# Patient Record
Sex: Female | Born: 1965 | Race: White | Hispanic: No | Marital: Married | Smoking: Never smoker
Health system: Southern US, Community
[De-identification: ages and names within clinical notes are randomized; demographics above are authoritative.]

## PROBLEM LIST (undated history)

## (undated) HISTORY — PX: HERNIA REPAIR: SHX51

---

## 1999-01-20 ENCOUNTER — Other Ambulatory Visit: Admission: RE | Admit: 1999-01-20 | Discharge: 1999-01-20 | Payer: Self-pay | Admitting: Obstetrics & Gynecology

## 2000-12-15 ENCOUNTER — Encounter: Payer: Self-pay | Admitting: Emergency Medicine

## 2000-12-15 ENCOUNTER — Emergency Department (HOSPITAL_COMMUNITY): Admission: EM | Admit: 2000-12-15 | Discharge: 2000-12-16 | Payer: Self-pay | Admitting: Emergency Medicine

## 2003-12-07 ENCOUNTER — Encounter: Admission: RE | Admit: 2003-12-07 | Discharge: 2003-12-07 | Payer: Self-pay | Admitting: Surgery

## 2004-03-17 ENCOUNTER — Emergency Department (HOSPITAL_COMMUNITY): Admission: EM | Admit: 2004-03-17 | Discharge: 2004-03-17 | Payer: Self-pay | Admitting: Emergency Medicine

## 2004-05-09 ENCOUNTER — Ambulatory Visit: Payer: Self-pay | Admitting: Gastroenterology

## 2004-05-10 ENCOUNTER — Ambulatory Visit: Payer: Self-pay | Admitting: Gastroenterology

## 2004-07-20 ENCOUNTER — Emergency Department (HOSPITAL_COMMUNITY): Admission: EM | Admit: 2004-07-20 | Discharge: 2004-07-20 | Payer: Self-pay | Admitting: Emergency Medicine

## 2007-11-07 ENCOUNTER — Encounter: Admission: RE | Admit: 2007-11-07 | Discharge: 2007-11-07 | Payer: Self-pay | Admitting: Obstetrics & Gynecology

## 2008-01-14 ENCOUNTER — Emergency Department (HOSPITAL_COMMUNITY): Admission: EM | Admit: 2008-01-14 | Discharge: 2008-01-14 | Payer: Self-pay | Admitting: Family Medicine

## 2013-12-25 ENCOUNTER — Encounter: Payer: Self-pay | Admitting: Gastroenterology

## 2014-01-18 ENCOUNTER — Other Ambulatory Visit: Payer: Self-pay | Admitting: Obstetrics & Gynecology

## 2014-01-19 LAB — CYTOLOGY - PAP

## 2014-02-10 ENCOUNTER — Other Ambulatory Visit: Payer: Self-pay | Admitting: Obstetrics & Gynecology

## 2014-02-10 DIAGNOSIS — R928 Other abnormal and inconclusive findings on diagnostic imaging of breast: Secondary | ICD-10-CM

## 2014-02-17 ENCOUNTER — Ambulatory Visit
Admission: RE | Admit: 2014-02-17 | Discharge: 2014-02-17 | Disposition: A | Discharge status: Home / Self Care | Payer: No Typology Code available for payment source | Source: Ambulatory Visit | Attending: Obstetrics & Gynecology | Admitting: Obstetrics & Gynecology

## 2014-02-17 DIAGNOSIS — R928 Other abnormal and inconclusive findings on diagnostic imaging of breast: Secondary | ICD-10-CM

## 2014-02-24 ENCOUNTER — Encounter: Payer: Self-pay | Admitting: Gastroenterology

## 2014-09-17 ENCOUNTER — Other Ambulatory Visit: Payer: Self-pay | Admitting: Obstetrics & Gynecology

## 2014-10-06 ENCOUNTER — Encounter: Payer: Self-pay | Admitting: Gastroenterology

## 2017-07-31 ENCOUNTER — Other Ambulatory Visit: Payer: Self-pay | Admitting: Obstetrics & Gynecology

## 2017-07-31 DIAGNOSIS — N6320 Unspecified lump in the left breast, unspecified quadrant: Secondary | ICD-10-CM

## 2017-12-13 ENCOUNTER — Other Ambulatory Visit (HOSPITAL_COMMUNITY): Payer: Self-pay | Admitting: *Deleted

## 2017-12-13 DIAGNOSIS — N6320 Unspecified lump in the left breast, unspecified quadrant: Secondary | ICD-10-CM

## 2018-01-14 ENCOUNTER — Ambulatory Visit
Admission: RE | Admit: 2018-01-14 | Discharge: 2018-01-14 | Disposition: A | Discharge status: Home / Self Care | Payer: No Typology Code available for payment source | Source: Ambulatory Visit | Attending: Obstetrics and Gynecology | Admitting: Obstetrics and Gynecology

## 2018-01-14 ENCOUNTER — Ambulatory Visit (HOSPITAL_COMMUNITY)
Admission: RE | Admit: 2018-01-14 | Discharge: 2018-01-14 | Disposition: A | Discharge status: Home / Self Care | Payer: Self-pay | Source: Ambulatory Visit | Attending: Obstetrics and Gynecology | Admitting: Obstetrics and Gynecology

## 2018-01-14 ENCOUNTER — Encounter (HOSPITAL_COMMUNITY): Payer: Self-pay

## 2018-01-14 VITALS — BP 110/72 | Ht 68.0 in

## 2018-01-14 DIAGNOSIS — N6320 Unspecified lump in the left breast, unspecified quadrant: Secondary | ICD-10-CM

## 2018-01-14 DIAGNOSIS — Z1239 Encounter for other screening for malignant neoplasm of breast: Secondary | ICD-10-CM

## 2018-01-14 DIAGNOSIS — N644 Mastodynia: Secondary | ICD-10-CM

## 2018-01-14 NOTE — Progress Notes (Signed)
Complaints of left outer breast and axillary pain x one year that comes and goes. Complaints of bilateral spontaneous nipple discharge that she sees on bra. Patient states it looks like a grayish color.  Pap Smear: Pap smear not completed today. Last Pap smear was in May 2019 at [facility] and normal per patient. Per patient has no history of an abnormal Pap smear. No Pap smear results are in Epic.  Physical exam: Breasts Breasts symmetrical. No skin abnormalities bilateral breasts. No nipple retraction bilateral breasts. No nipple discharge bilateral breasts. Unable to express any nipple discharge from bilateral breasts on exam. No lymphadenopathy. No lumps palpated bilateral breasts. Complaints of left outer and inner breast pain on exam. Complaints of right outer breast tenderness on exam. [facility]. Appointment scheduled for Tuesday, January 14, 2018 at 1320.        Pelvic/Bimanual No Pap smear completed today since last Pap smear was in May 2019 per patient. Pap smear not indicated per BCCCP guidelines.   Smoking History: Patient is a former e-cigarette smoker that quit in 2017.  Patient Navigation: Patient education provided. [facility].   Colorectal Cancer Screening: Per patient had a colonoscopy completed in 2005. No complaints today. FIT Test given to patient to complete and return to BCCCP.  Breast and Cervical Cancer Risk Assessment: Patient has no family history of breast cancer, known genetic mutations, or radiation treatment to the chest before age 53. Patient has no history of cervical dysplasia, immunocompromised, or DES exposure in-utero.  Risk Assessment    Risk Scores      01/14/2018   Last edited by: Priscille HeidelbergBrannock, Eladia Frame P, RN   5-year risk: 1 %   Lifetime risk: 7.7 %

## 2018-01-14 NOTE — Patient Instructions (Signed)
Explained breast self awareness with Guinevere FerrariJudy Vanaman. Patient did not need a Pap smear today due to last Pap smear was in May 2019 per patient. Let her know BCCCP will cover Pap smears every 3 years unless has a history of abnormal Pap smears. [facility]. Appointment scheduled for Tuesday, January 14, 2018 at 1320. Darel HongJudy Whitfill verbalized understanding.  Eliza Green, Kathaleen Maserhristine Poll, RN 1:39 PM

## 2018-01-15 ENCOUNTER — Encounter (HOSPITAL_COMMUNITY): Payer: Self-pay | Admitting: *Deleted

## 2018-04-02 ENCOUNTER — Other Ambulatory Visit: Payer: Self-pay

## 2018-04-05 LAB — FECAL OCCULT BLOOD, IMMUNOCHEMICAL: FECAL OCCULT BLD: NEGATIVE

## 2018-04-18 ENCOUNTER — Encounter (HOSPITAL_COMMUNITY): Payer: Self-pay

## 2019-05-25 ENCOUNTER — Ambulatory Visit: Payer: HRSA Program | Attending: Internal Medicine

## 2019-05-25 DIAGNOSIS — R238 Other skin changes: Secondary | ICD-10-CM | POA: Diagnosis present

## 2019-05-25 DIAGNOSIS — Z20828 Contact with and (suspected) exposure to other viral communicable diseases: Secondary | ICD-10-CM | POA: Insufficient documentation

## 2019-05-25 DIAGNOSIS — U071 COVID-19: Secondary | ICD-10-CM

## 2019-05-27 LAB — NOVEL CORONAVIRUS, NAA: SARS-CoV-2, NAA: NOT DETECTED

## 2020-01-09 ENCOUNTER — Ambulatory Visit (HOSPITAL_COMMUNITY)
Admission: EM | Admit: 2020-01-09 | Discharge: 2020-01-09 | Disposition: A | Discharge status: Home / Self Care | Payer: HRSA Program | Attending: Physician Assistant | Admitting: Physician Assistant

## 2020-01-09 ENCOUNTER — Encounter (HOSPITAL_COMMUNITY): Payer: Self-pay

## 2020-01-09 DIAGNOSIS — Z20822 Contact with and (suspected) exposure to covid-19: Secondary | ICD-10-CM | POA: Insufficient documentation

## 2020-01-09 DIAGNOSIS — R5383 Other fatigue: Secondary | ICD-10-CM | POA: Insufficient documentation

## 2020-01-09 NOTE — ED Provider Notes (Signed)
[facility]    CSN: [id number] Arrival date & time: 01/09/20  1737      History   Chief Complaint Chief Complaint  Patient presents with   Fatigue    HPI Brenda Parrish is a 54 y.o. female.   She reports for Covid testing.  She had an episode of fatigue last night after working in the yard.  She felt more drugged out than usual.  Denies any chest pain, shortness of breath or nausea or sweating when this occurred..  Reports she feels much better today.  Denies chest pain, shortness of breath, cough, runny nose, sore throat.  States she otherwise feels well.  She does not want to Covid test that she has bouts are taking care of her father.     History reviewed. No pertinent past medical history.  There are no problems to display for this patient.   History reviewed. No pertinent surgical history.  OB History    Gravida  2   Para      Term      Preterm      AB      Living  2     SAB      TAB      Ectopic      Multiple      Live Births  2            Home Medications    Prior to Admission medications   Not on File    Family History Family History  Problem Relation Age of Onset   Diabetes Mother    Diabetes Father    Cancer Father        prostate cancer    Social History Social History   Tobacco Use   Smoking status: Never Smoker   Smokeless tobacco: Never Used  Vaping Use   Vaping Use: Former   Start date: 01/14/2014   Quit date: 01/15/2016  Substance Use Topics   Alcohol use: Not Currently   Drug use: Never     Allergies   Latex and Sulfa antibiotics   Review of Systems Review of Systems   Physical Exam Triage Vital Signs ED Triage Vitals  Enc Vitals Group     BP      Pulse      Resp      Temp      Temp src      SpO2      Weight      Height      Head Circumference      Peak Flow      Pain Score      Pain Loc      Pain Edu?      Excl. in GC?    No data found.  Updated Vital Signs BP  113/68    Pulse 96    Temp 98 F (36.7 C) (Oral)    Resp 16    Ht 5' 7.5" (1.715 m)    Wt 289 lb (131.1 kg)    SpO2 99%    BMI 44.60 kg/m   Visual Acuity Right Eye Distance:   Left Eye Distance:   Bilateral Distance:    Right Eye Near:   Left Eye Near:    Bilateral Near:     Physical Exam Vitals and nursing note reviewed.  Constitutional:      General: She is not in acute distress.    Appearance: She is well-developed.  HENT:  Head: Normocephalic and atraumatic.  Eyes:     Conjunctiva/sclera: Conjunctivae normal.  Cardiovascular:     Rate and Rhythm: Normal rate and regular rhythm.     Heart sounds: No murmur heard.   Pulmonary:     Effort: Pulmonary effort is normal. No respiratory distress.     Breath sounds: Normal breath sounds.  Abdominal:     Palpations: Abdomen is soft.     Tenderness: There is no abdominal tenderness.  Musculoskeletal:     Cervical back: Neck supple.  Skin:    General: Skin is warm and dry.  Neurological:     Mental Status: She is alert.      UC Treatments / Results  Labs (all labs ordered are listed, but only abnormal results are displayed) Labs Reviewed  SARS CORONAVIRUS 2 (TAT 6-24 HRS)    EKG   Radiology No results found.  Procedures Procedures (including critical care time)  Medications Ordered in UC Medications - No data to display  Initial Impression / Assessment and Plan / UC Course  I have reviewed the triage vital signs and the nursing notes.  Pertinent labs & imaging results that were available during my care of the patient were reviewed by me and considered in my medical decision making (see chart for details).     #Fatigue #Encounter for Covid test Patient is a 54 year old man episode of increased fatigue after working in the yard.  Feeling much better today.  Normal vital signs and reassuring exam.  We will send Covid test.  Discussed emergency department precautions.  Instructed patient that she is to  follow-up with primary care provider, internal medicine resource given.  Patient verbalized agreement understanding plan of care. Final Clinical Impressions(s) / UC Diagnoses   Final diagnoses:  Fatigue, unspecified type  Encounter for laboratory testing for COVID-19 virus     Discharge Instructions     Estbablish with a primary care  If chest pain, shortness of breath accompany fatigue at any point, go to the Emergency department   If your Covid-19 test is positive, you will receive a phone call from [facility] regarding your results. Negative test results are not called. Both positive and negative results area always visible on MyChart. If you do not have a MyChart account, sign up instructions are in your discharge papers.   Persons who are directed to care for themselves at home may discontinue isolation under the following conditions:   At least 10 days have passed since symptom onset and  At least 24 hours have passed without running a fever (this means without the use of fever-reducing medications) and  Other symptoms have improved.  Persons infected with COVID-19 who never develop symptoms may discontinue isolation and other precautions 10 days after the date of their first positive COVID-19 test.     ED Prescriptions    None     PDMP not reviewed this encounter.   Hermelinda Medicus, PA-C 01/09/20 1855

## 2020-01-09 NOTE — ED Triage Notes (Signed)
Pt c/o fatigue started last night and wants COVID test.

## 2020-01-09 NOTE — Discharge Instructions (Addendum)
Estbablish with a primary care  If chest pain, shortness of breath accompany fatigue at any point, go to the Emergency department   If your Covid-19 test is positive, you will receive a phone call from [facility] regarding your results. Negative test results are not called. Both positive and negative results area always visible on MyChart. If you do not have a MyChart account, sign up instructions are in your discharge papers.   Persons who are directed to care for themselves at home may discontinue isolation under the following conditions:   At least 10 days have passed since symptom onset and  At least 24 hours have passed without running a fever (this means without the use of fever-reducing medications) and  Other symptoms have improved.  Persons infected with COVID-19 who never develop symptoms may discontinue isolation and other precautions 10 days after the date of their first positive COVID-19 test.

## 2020-01-10 LAB — SARS CORONAVIRUS 2 (TAT 6-24 HRS): SARS Coronavirus 2: NEGATIVE

## 2020-07-14 IMAGING — MG DIGITAL DIAGNOSTIC BILATERAL MAMMOGRAM WITH TOMO AND CAD
6 of 10 series · 6 of 30 positions shown · non-contrast
Comparison: Previous exam(s).

CLINICAL DATA: Palpable lump within the upper-outer quadrant of the
LEFT breast for approximately 1 year. History of LEFT breast cyst.

EXAM:
DIGITAL DIAGNOSTIC BILATERAL MAMMOGRAM WITH CAD AND TOMO
ULTRASOUND LEFT BREAST

[R MLO synth-2D]
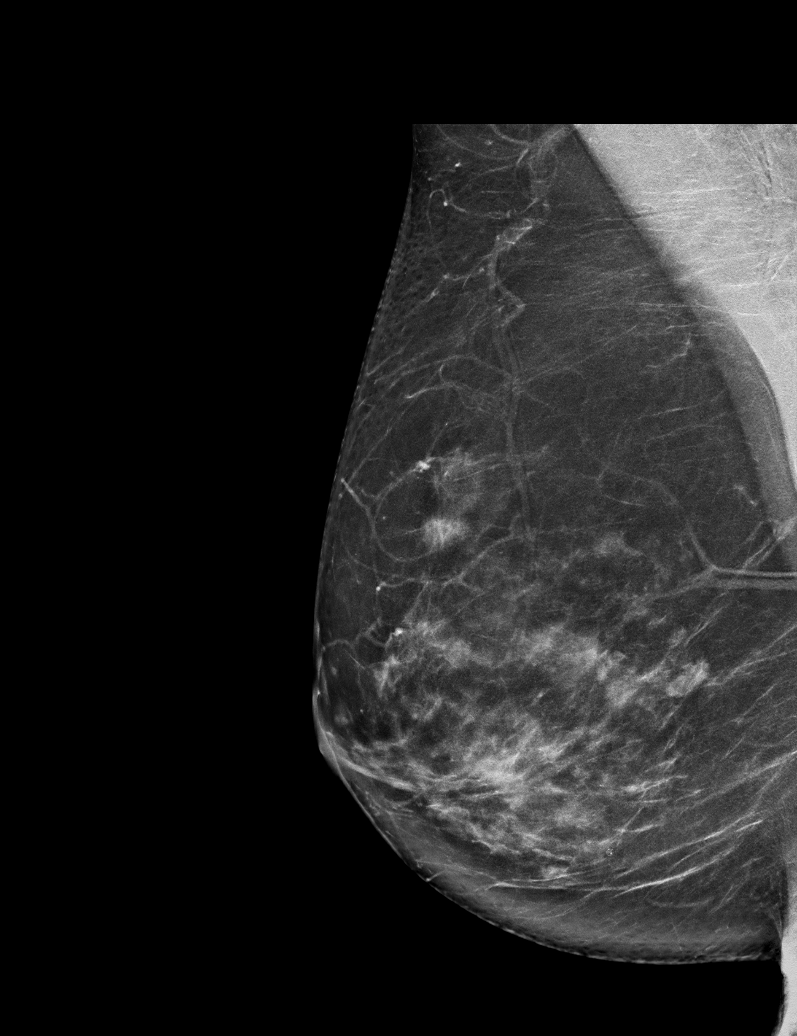

[L TAN synth-2D]
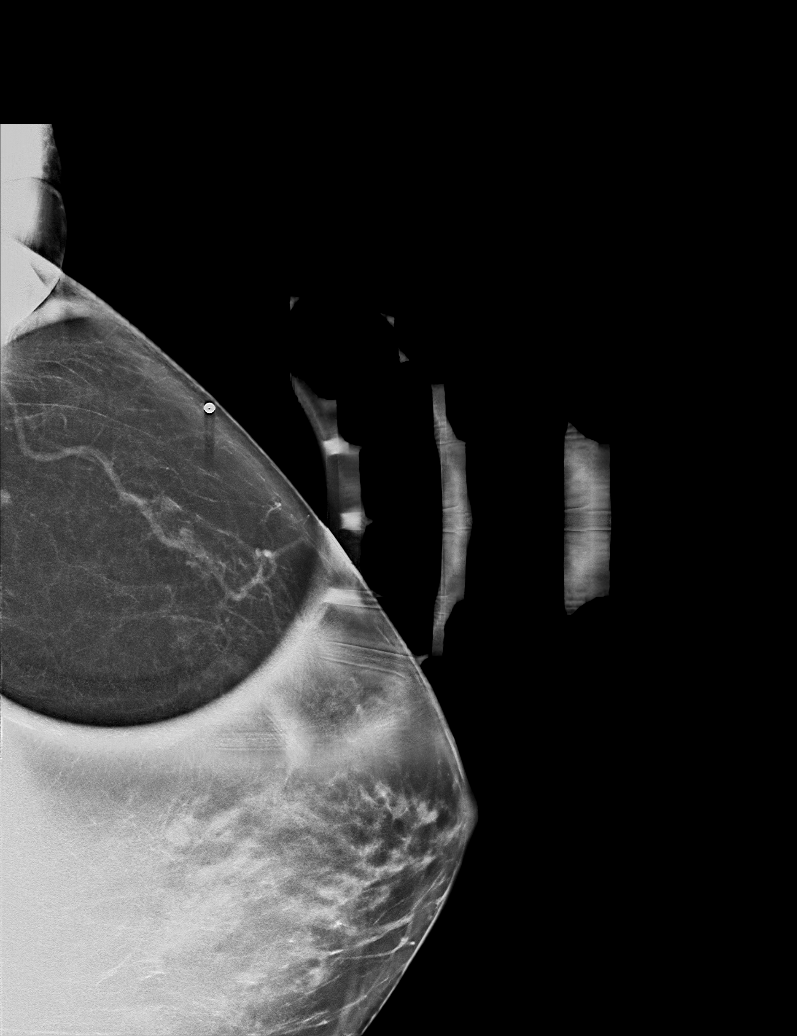

[L CC synth-2D]
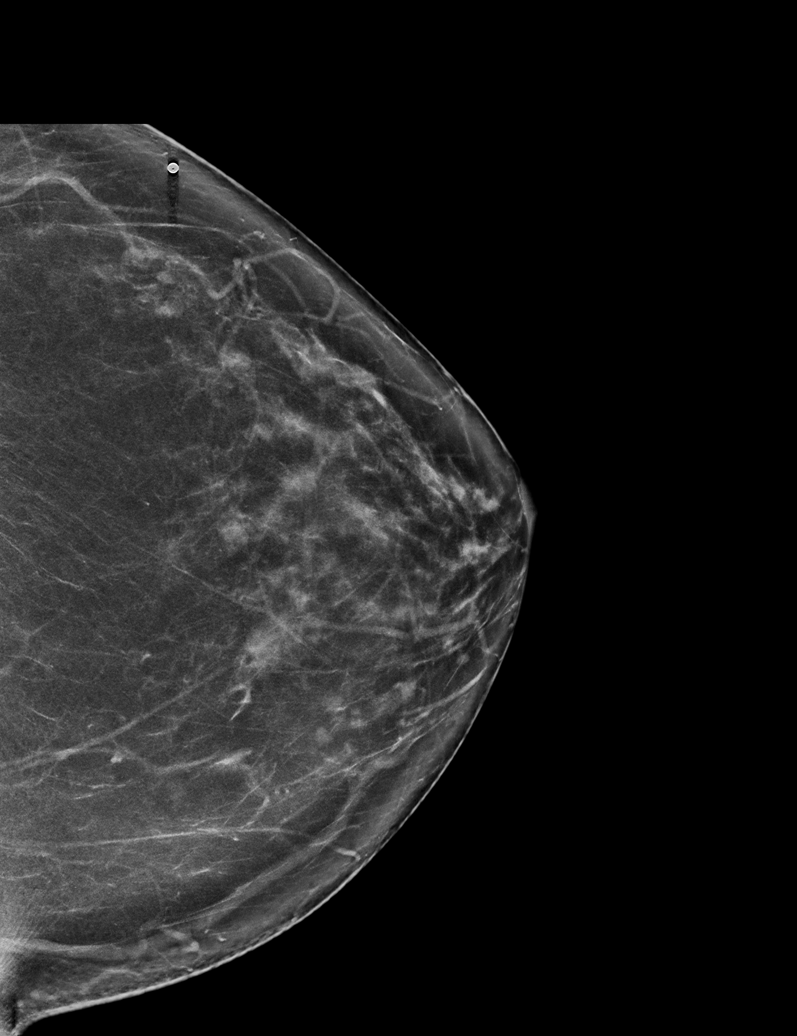

[R CC synth-2D]
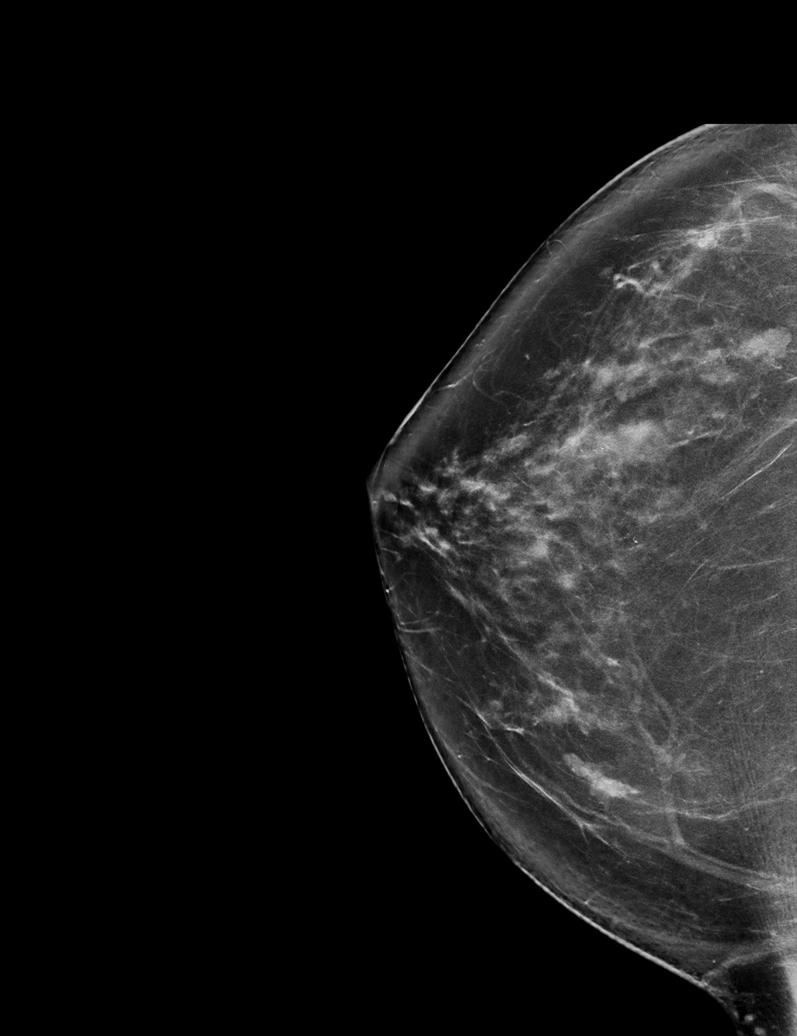

[L MLO synth-2D]
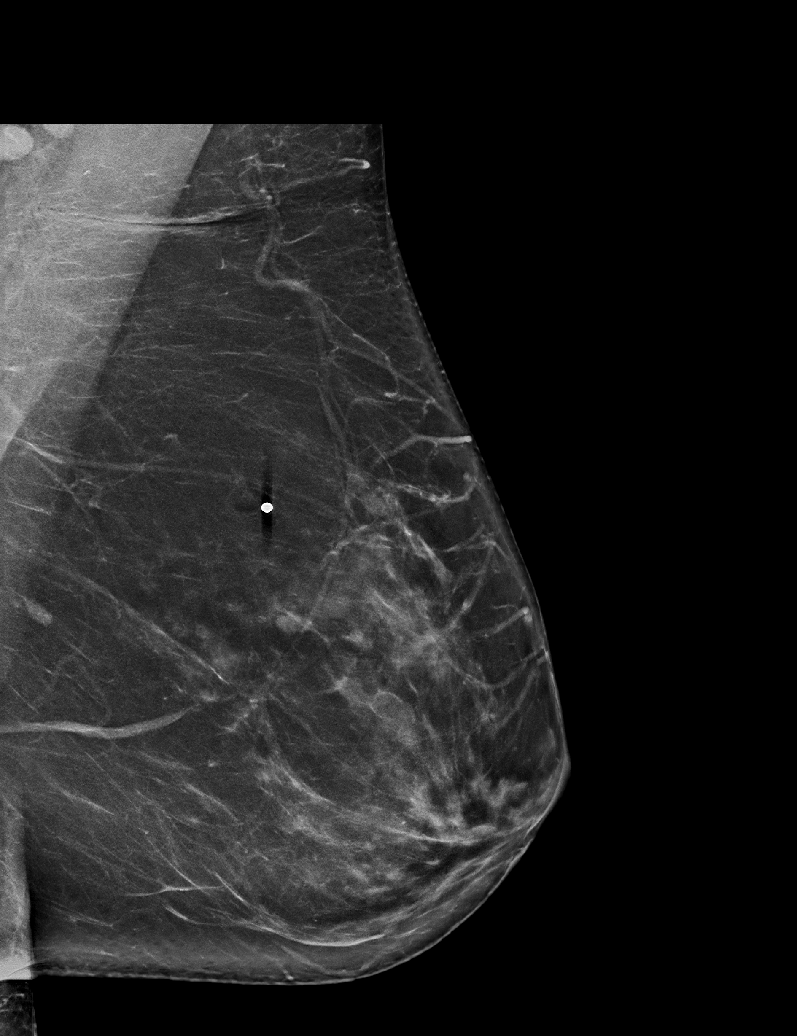

[R CC tomo · tomo slice 41/82.0]
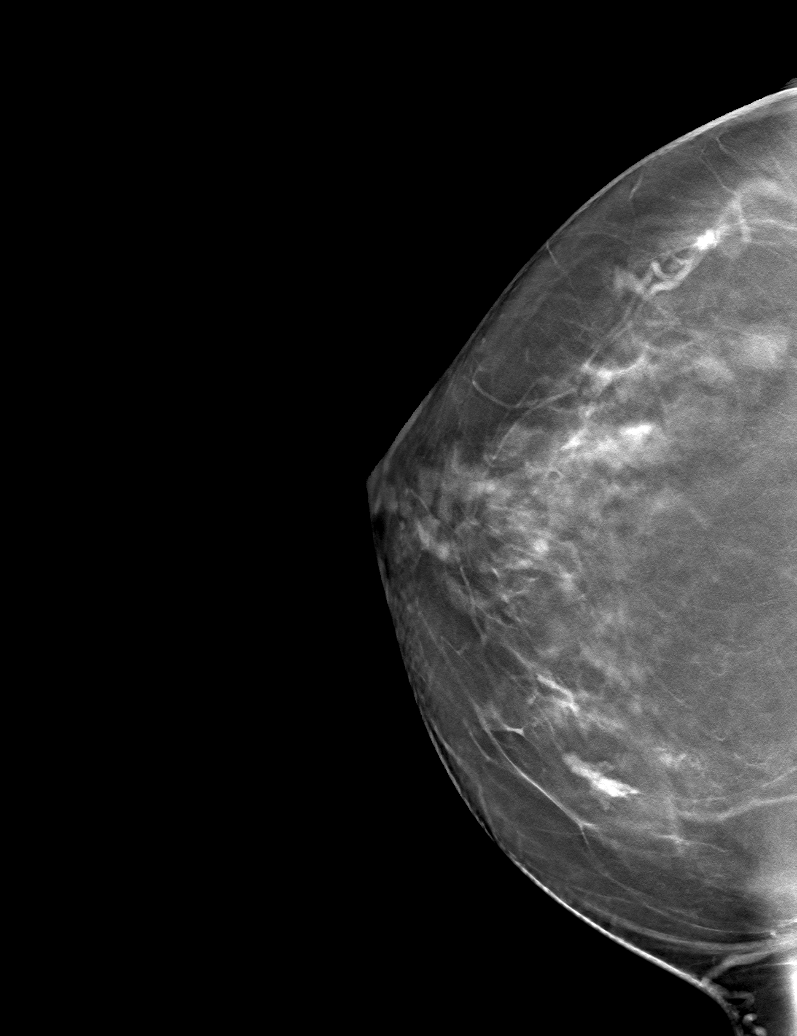

[6 of 30 positions shown; findings below may reference images not displayed]

ACR Breast Density Category c: The breast tissue is heterogeneously
dense, which may obscure small masses.
FINDINGS: There are no new dominant masses, suspicious calcifications or
secondary signs of malignancy within either breast.

There are multiple small oval circumscribed masses within the
upper-outer quadrant of the LEFT breast, located in the vicinity of
the area of clinical concern with overlying skin marker in place,
largest measuring approximately 4 mm greatest dimension, suspected
benign fibrocystic change, possible correlate for the palpable lump.

Mammographic images were processed with CAD.

Targeted ultrasound is performed, evaluating the outer LEFT breast
as directed by the patient, showing a ridge of normal dense
fibroglandular tissue within the LEFT breast at the 3:30 o'clock
axis, containing several small benign cysts. No suspicious solid or
cystic mass is identified by ultrasound.
IMPRESSION: No evidence of malignancy within either breast. Small benign cysts
within the outer LEFT breast, compatible with benign fibrocystic
change, corresponding to today's area of clinical concern.

RECOMMENDATION:
Screening mammogram in one year.(Code:TC-P-JQ3)

I have discussed the findings and recommendations with the patient.
Results were also provided in writing at the conclusion of the
visit. If applicable, a reminder letter will be sent to the patient
regarding the next appointment.

BI-RADS CATEGORY  2: Benign.

## 2020-07-14 IMAGING — US ULTRASOUND LEFT BREAST LIMITED
1 series · 9 of 9 positions shown · non-contrast
Comparison: Previous exam(s).

CLINICAL DATA: Palpable lump within the upper-outer quadrant of the
LEFT breast for approximately 1 year. History of LEFT breast cyst.

EXAM:
DIGITAL DIAGNOSTIC BILATERAL MAMMOGRAM WITH CAD AND TOMO
ULTRASOUND LEFT BREAST

[Series 1: ultrasound left breast limited · 0.06mm/px · 9 of 9 slices shown]
[im 1/9]
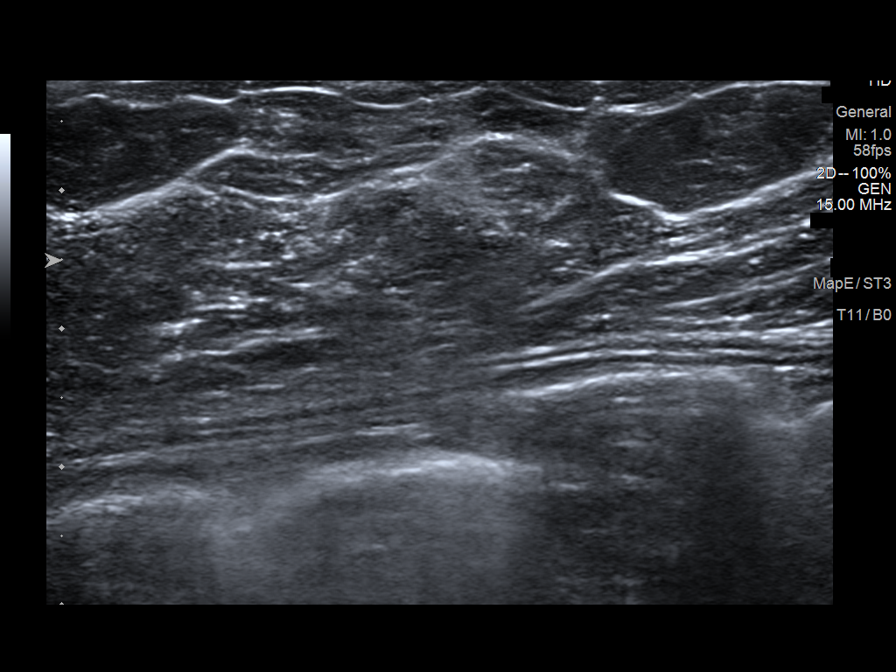
[im 2/9]
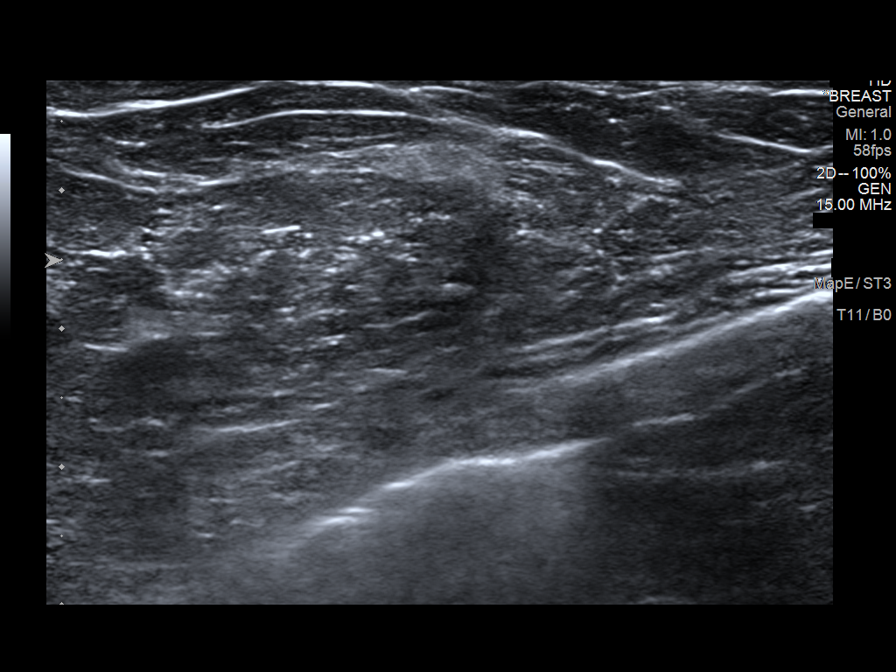
[im 3/9]
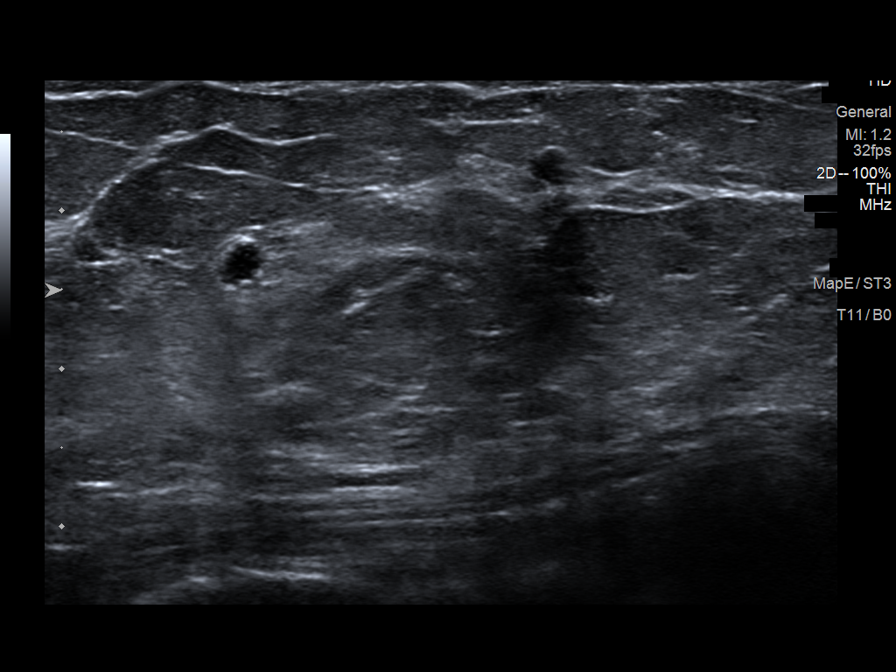
[im 4/9]
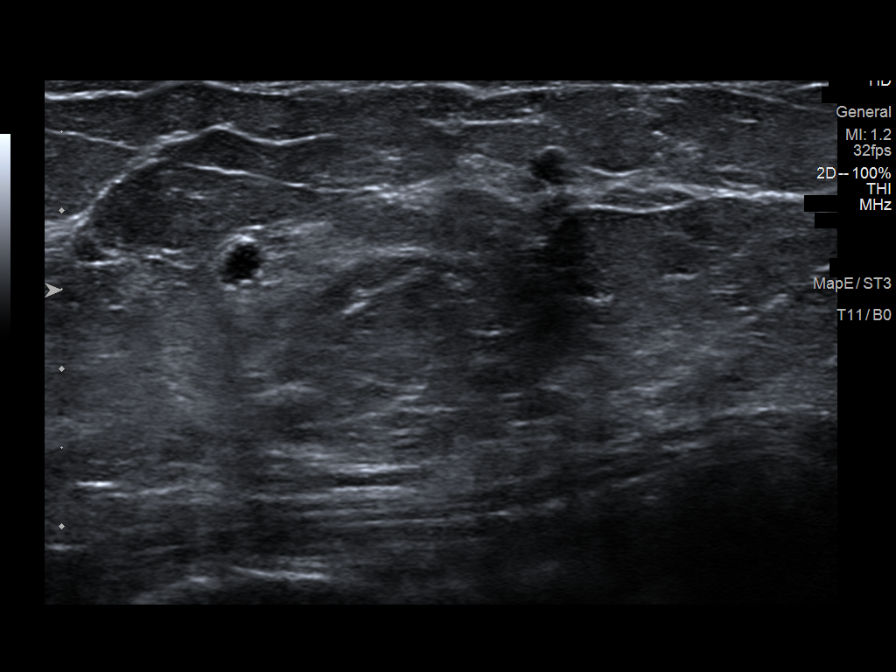
[im 5/9]
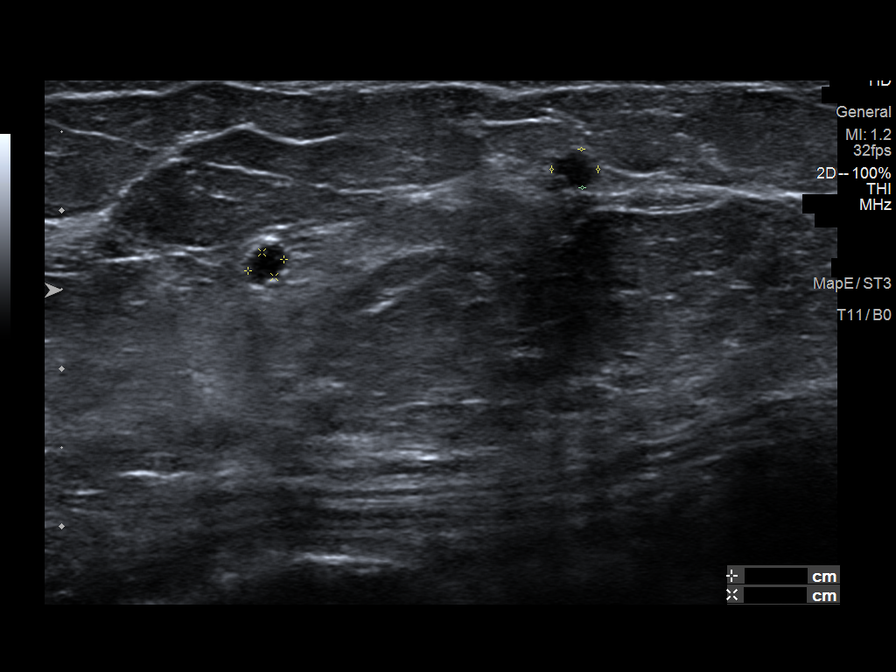
[im 6/9]
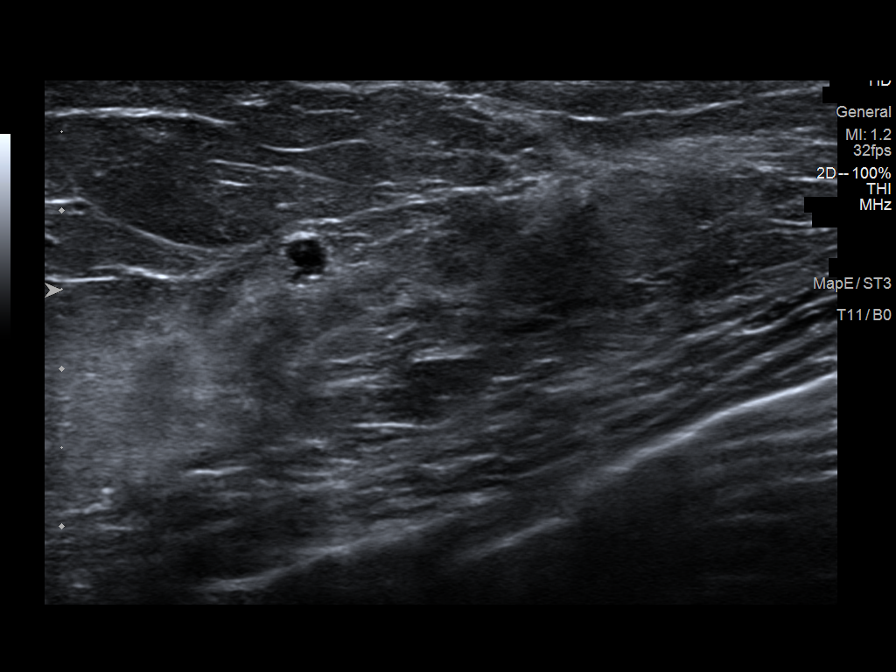
[im 7/9]
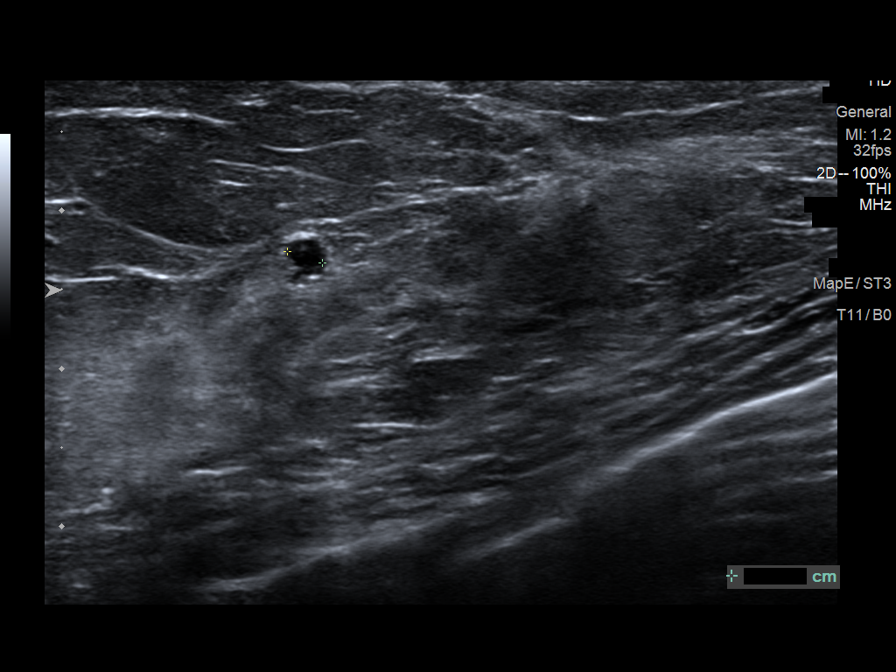
[im 8/9]
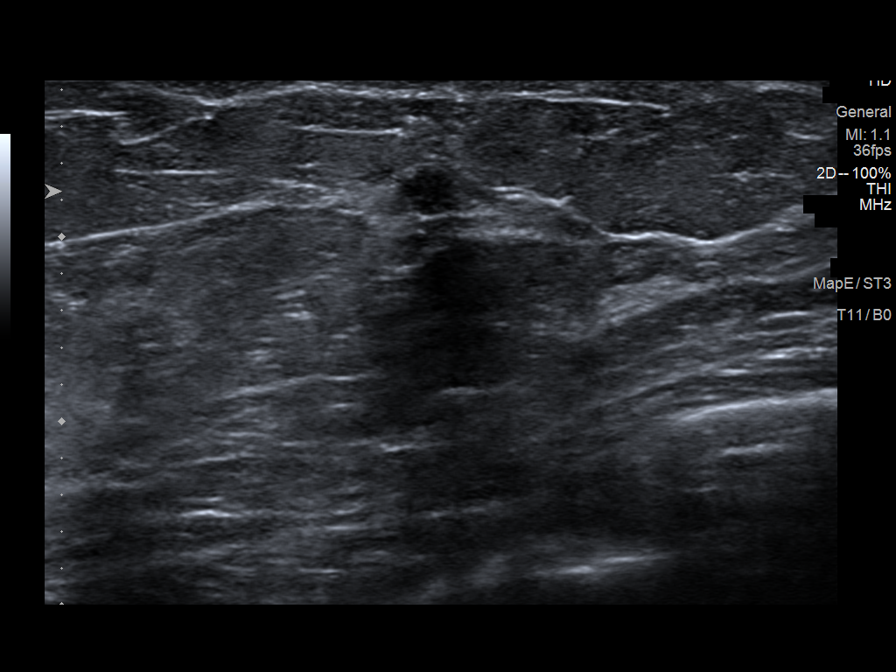
[im 9/9]
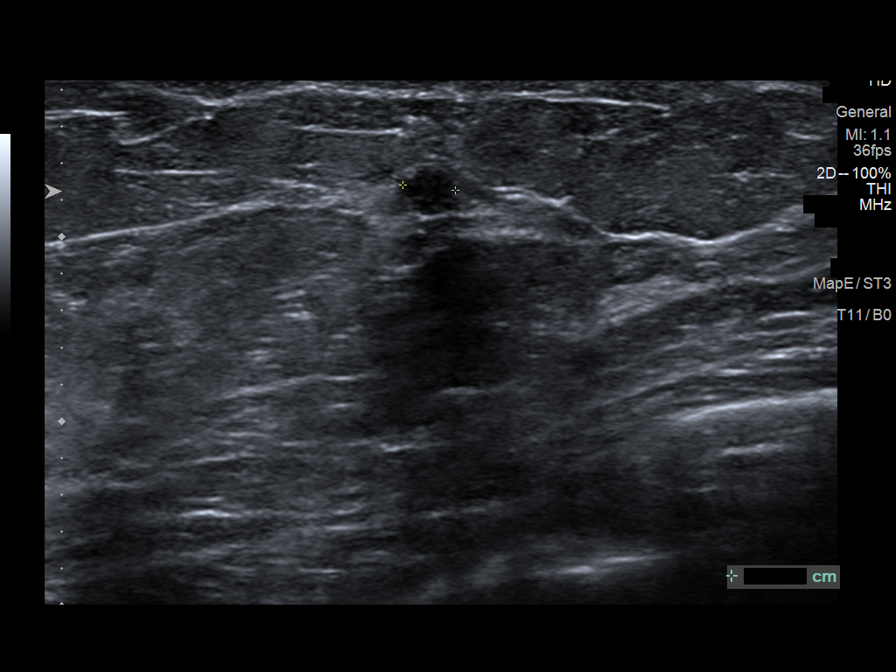

[9 of 9 positions shown; findings below may reference images not displayed]

ACR Breast Density Category c: The breast tissue is heterogeneously
dense, which may obscure small masses.
FINDINGS: There are no new dominant masses, suspicious calcifications or
secondary signs of malignancy within either breast.

There are multiple small oval circumscribed masses within the
upper-outer quadrant of the LEFT breast, located in the vicinity of
the area of clinical concern with overlying skin marker in place,
largest measuring approximately 4 mm greatest dimension, suspected
benign fibrocystic change, possible correlate for the palpable lump.

Mammographic images were processed with CAD.

Targeted ultrasound is performed, evaluating the outer LEFT breast
as directed by the patient, showing a ridge of normal dense
fibroglandular tissue within the LEFT breast at the 3:30 o'clock
axis, containing several small benign cysts. No suspicious solid or
cystic mass is identified by ultrasound.
IMPRESSION: No evidence of malignancy within either breast. Small benign cysts
within the outer LEFT breast, compatible with benign fibrocystic
change, corresponding to today's area of clinical concern.

RECOMMENDATION:
Screening mammogram in one year.(Code:TC-P-JQ3)

I have discussed the findings and recommendations with the patient.
Results were also provided in writing at the conclusion of the
visit. If applicable, a reminder letter will be sent to the patient
regarding the next appointment.

BI-RADS CATEGORY  2: Benign.

## 2020-11-23 ENCOUNTER — Encounter (HOSPITAL_COMMUNITY): Payer: Self-pay

## 2020-11-23 ENCOUNTER — Telehealth (HOSPITAL_COMMUNITY): Payer: Self-pay

## 2020-11-23 ENCOUNTER — Other Ambulatory Visit: Payer: Self-pay

## 2020-11-23 ENCOUNTER — Ambulatory Visit (HOSPITAL_COMMUNITY)
Admission: EM | Admit: 2020-11-23 | Discharge: 2020-11-23 | Disposition: A | Discharge status: Home / Self Care | Payer: 59 | Attending: Family Medicine | Admitting: Family Medicine

## 2020-11-23 DIAGNOSIS — R631 Polydipsia: Secondary | ICD-10-CM | POA: Insufficient documentation

## 2020-11-23 DIAGNOSIS — R5383 Other fatigue: Secondary | ICD-10-CM | POA: Insufficient documentation

## 2020-11-23 DIAGNOSIS — R635 Abnormal weight gain: Secondary | ICD-10-CM | POA: Insufficient documentation

## 2020-11-23 LAB — CBC
HCT: 46.7 % — ABNORMAL HIGH (ref 36.0–46.0)
Hemoglobin: 15.3 g/dL — ABNORMAL HIGH (ref 12.0–15.0)
MCH: 30.7 pg (ref 26.0–34.0)
MCHC: 32.8 g/dL (ref 30.0–36.0)
MCV: 93.8 fL (ref 80.0–100.0)
Platelets: 254 10*3/uL (ref 150–400)
RBC: 4.98 MIL/uL (ref 3.87–5.11)
RDW: 12.6 % (ref 11.5–15.5)
WBC: 9.5 10*3/uL (ref 4.0–10.5)
nRBC: 0 % (ref 0.0–0.2)

## 2020-11-23 LAB — POCT URINALYSIS DIPSTICK, ED / UC
Bilirubin Urine: NEGATIVE
Glucose, UA: NEGATIVE mg/dL
Ketones, ur: NEGATIVE mg/dL
Leukocytes,Ua: NEGATIVE
Nitrite: NEGATIVE
Protein, ur: NEGATIVE mg/dL
Specific Gravity, Urine: 1.025 (ref 1.005–1.030)
Urobilinogen, UA: 0.2 mg/dL (ref 0.0–1.0)
pH: 5 (ref 5.0–8.0)

## 2020-11-23 LAB — COMPREHENSIVE METABOLIC PANEL
ALT: 26 U/L (ref 0–44)
AST: 37 U/L (ref 15–41)
Albumin: 3.8 g/dL (ref 3.5–5.0)
Alkaline Phosphatase: 76 U/L (ref 38–126)
Anion gap: 10 (ref 5–15)
BUN: 10 mg/dL (ref 6–20)
CO2: 27 mmol/L (ref 22–32)
Calcium: 9.3 mg/dL (ref 8.9–10.3)
Chloride: 103 mmol/L (ref 98–111)
Creatinine, Ser: 0.7 mg/dL (ref 0.44–1.00)
GFR, Estimated: >60 mL/min (ref >60)
Glucose, Bld: 118 mg/dL — ABNORMAL HIGH (ref 70–99)
Potassium: 4.3 mmol/L (ref 3.5–5.1)
Sodium: 140 mmol/L (ref 135–145)
Total Bilirubin: 0.1 mg/dL — ABNORMAL LOW (ref 0.3–1.2)
Total Protein: 6.9 g/dL (ref 6.5–8.1)

## 2020-11-23 LAB — TSH: TSH: 1.329 u[IU]/mL (ref 0.350–4.500)

## 2020-11-23 LAB — CBG MONITORING, ED: Glucose-Capillary: 88 mg/dL (ref 70–99)

## 2020-11-23 NOTE — Discharge Instructions (Addendum)
You have had labs (blood work) drawn today. We will call you with any significant abnormalities or if there is need to begin or change treatment or pursue further follow up.  You may also review your test results online through MyChart. If you do not have a MyChart account, instructions to sign up should be on your discharge paperwork.  You blood sugar was within normal limits here: 88.

## 2020-11-23 NOTE — ED Triage Notes (Addendum)
Pt c/o excessively eating, weight gain, polyuria, fatigue for years but worsening today. Pt concerned for diabetes. Pt reports intermittent pain in left side of rib cage worse when eating. Pt also reports intermittent sweats and feels bilateral lower legs are swollen.

## 2020-11-26 NOTE — ED Provider Notes (Signed)
[facility]   [id number] 11/23/20 Arrival Time: 1717  ASSESSMENT & PLAN:  1. Fatigue, unspecified type   2. Unintended weight gain   3. Increased thirst    Unclear etiology of current symptoms.   Labs Reviewed  CBC - Abnormal; Notable for the following components:      Result Value   Hemoglobin 15.3 (*)    HCT 46.7 (*)    All other components within normal limits  COMPREHENSIVE METABOLIC PANEL - Abnormal; Notable for the following components:   Glucose, Bld 118 (*)    Total Bilirubin 0.1 (*)    All other components within normal limits  POCT URINALYSIS DIPSTICK, ED / UC - Abnormal; Notable for the following components:   Hgb urine dipstick TRACE (*)    All other components within normal limits  TSH  CBG MONITORING, ED   Plans f/u with a PCP for further workup.    Follow-up Information     Schedule an appointment as soon as possible for a visit  with Aliene Beams, MD.   Specialty: Family Medicine Contact information: [address] [place] [address] [phone]                 Reviewed expectations re: course of current medical issues. Questions answered. Outlined signs and symptoms indicating need for more acute intervention. Understanding verbalized. After Visit Summary given.   SUBJECTIVE: History from: patient. Brenda Parrish is a 55 y.o. female who reports unintentional wt gain, episodes of urinary frequency and increased thirst; overall fatigue. Occal pain of L chest when eating. Normal PO intake without n/v/d. Occas bilateral LE edema after prolonged sitting. No assoc CP/SOB. Denies: fever and sore throat. Normal PO intake without n/v/d.   OBJECTIVE:  Vitals:   11/23/20 1806  BP: 119/80  Pulse: 93  Resp: 18  Temp: 98.4 F (36.9 C)  SpO2: 99%    General appearance: alert; no distress Eyes: PERRLA; EOMI; conjunctiva normal HENT: [place]; AT; without nasal congestion Neck: supple  Lungs: speaks full sentences without  difficulty; unlabored Extremities: no edema Skin: warm and dry Neurologic: normal gait Psychological: alert and cooperative; normal mood and affect  Labs: Results for orders placed or performed during the hospital encounter of 11/23/20  CBC  Result Value Ref Range   WBC 9.5 4.0 - 10.5 K/uL   RBC 4.98 3.87 - 5.11 MIL/uL   Hemoglobin 15.3 (H) 12.0 - 15.0 g/dL   HCT 38.7 (H) 56.4 - 33.2 %   MCV 93.8 80.0 - 100.0 fL   MCH 30.7 26.0 - 34.0 pg   MCHC 32.8 30.0 - 36.0 g/dL   RDW 95.1 88.4 - 16.6 %   Platelets 254 150 - 400 K/uL   nRBC 0.0 0.0 - 0.2 %  Comprehensive metabolic panel  Result Value Ref Range   Sodium 140 135 - 145 mmol/L   Potassium 4.3 3.5 - 5.1 mmol/L   Chloride 103 98 - 111 mmol/L   CO2 27 22 - 32 mmol/L   Glucose, Bld 118 (H) 70 - 99 mg/dL   BUN 10 6 - 20 mg/dL   Creatinine, Ser 0.63 0.44 - 1.00 mg/dL   Calcium 9.3 8.9 - 01.6 mg/dL   Total Protein 6.9 6.5 - 8.1 g/dL   Albumin 3.8 3.5 - 5.0 g/dL   AST 37 15 - 41 U/L   ALT 26 0 - 44 U/L   Alkaline Phosphatase 76 38 - 126 U/L   Total Bilirubin 0.1 (L) 0.3 -  1.2 mg/dL   GFR, Estimated >13 >08 mL/min   Anion gap 10 5 - 15  TSH  Result Value Ref Range   TSH 1.329 0.350 - 4.500 uIU/mL  POC Urinalysis dipstick  Result Value Ref Range   Glucose, UA NEGATIVE NEGATIVE mg/dL   Bilirubin Urine NEGATIVE NEGATIVE   Ketones, ur NEGATIVE NEGATIVE mg/dL   Specific Gravity, Urine 1.025 1.005 - 1.030   Hgb urine dipstick TRACE (A) NEGATIVE   pH 5.0 5.0 - 8.0   Protein, ur NEGATIVE NEGATIVE mg/dL   Urobilinogen, UA 0.2 0.0 - 1.0 mg/dL   Nitrite NEGATIVE NEGATIVE   Leukocytes,Ua NEGATIVE NEGATIVE  POC CBG monitoring  Result Value Ref Range   Glucose-Capillary 88 70 - 99 mg/dL     Allergies  Allergen Reactions   Latex Rash    Swelling   Sulfa Antibiotics     Pt reports itching but unsure    History reviewed. No pertinent past medical history. Social History   Socioeconomic History   Marital status: Married     Spouse name: Not on file   Number of children: Not on file   Years of education: Not on file   Highest education level: Not on file  Occupational History   Not on file  Tobacco Use   Smoking status: Never   Smokeless tobacco: Never  Vaping Use   Vaping Use: Former   Start date: 01/14/2014   Quit date: 01/15/2016  Substance and Sexual Activity   Alcohol use: Not Currently   Drug use: Never   Sexual activity: Yes    Birth control/protection: Surgical    Comment: partner vasectomy  Other Topics Concern   Not on file  Social History Narrative   Not on file   Social Determinants of Health   Financial Resource Strain: Not on file  Food Insecurity: Not on file  Transportation Needs: Not on file  Physical Activity: Not on file  Stress: Not on file  Social Connections: Not on file  Intimate Partner Violence: Not on file   Family History  Problem Relation Age of Onset   Diabetes Mother    Diabetes Father    Cancer Father        prostate cancer   Past Surgical History:  Procedure Laterality Date   HERNIA REPAIR     with mesh per pt     Mardella Layman, MD 11/26/20 [phone]

## 2022-02-01 ENCOUNTER — Other Ambulatory Visit: Payer: Self-pay | Admitting: Gastroenterology

## 2022-02-01 DIAGNOSIS — R1310 Dysphagia, unspecified: Secondary | ICD-10-CM

## 2022-02-02 ENCOUNTER — Telehealth (HOSPITAL_COMMUNITY): Payer: Self-pay

## 2022-02-02 NOTE — Telephone Encounter (Signed)
Attempted to contact patient to schedule Modified Barium Swallow - vm is full. Other number provided is out of service.

## 2022-02-05 ENCOUNTER — Other Ambulatory Visit: Payer: No Typology Code available for payment source

## 2022-02-12 ENCOUNTER — Ambulatory Visit
Admission: RE | Admit: 2022-02-12 | Discharge: 2022-02-12 | Disposition: A | Discharge status: Home / Self Care | Payer: 59 | Source: Ambulatory Visit | Attending: Gastroenterology | Admitting: Gastroenterology

## 2022-02-12 DIAGNOSIS — R1310 Dysphagia, unspecified: Secondary | ICD-10-CM

## 2022-02-13 ENCOUNTER — Other Ambulatory Visit: Payer: Self-pay | Admitting: Gastroenterology

## 2022-02-13 ENCOUNTER — Telehealth (HOSPITAL_COMMUNITY): Payer: Self-pay

## 2022-02-13 NOTE — Telephone Encounter (Signed)
2nd attempt to contact patient to schedule Modified Barium Swallow - vm is full. 

## 2022-03-01 ENCOUNTER — Other Ambulatory Visit (HOSPITAL_COMMUNITY): Payer: Self-pay

## 2022-03-01 DIAGNOSIS — R1310 Dysphagia, unspecified: Secondary | ICD-10-CM

## 2022-03-09 ENCOUNTER — Ambulatory Visit (HOSPITAL_COMMUNITY)
Admission: RE | Admit: 2022-03-09 | Discharge: 2022-03-09 | Disposition: A | Discharge status: Home / Self Care | Payer: 59 | Source: Ambulatory Visit

## 2022-03-09 ENCOUNTER — Ambulatory Visit (HOSPITAL_COMMUNITY)
Admission: RE | Admit: 2022-03-09 | Discharge: 2022-03-09 | Disposition: A | Discharge status: Home / Self Care | Payer: 59 | Source: Ambulatory Visit | Attending: Gastroenterology | Admitting: Gastroenterology

## 2022-03-09 DIAGNOSIS — K449 Diaphragmatic hernia without obstruction or gangrene: Secondary | ICD-10-CM | POA: Insufficient documentation

## 2022-03-09 DIAGNOSIS — R1310 Dysphagia, unspecified: Secondary | ICD-10-CM | POA: Insufficient documentation

## 2022-03-09 DIAGNOSIS — Z79899 Other long term (current) drug therapy: Secondary | ICD-10-CM | POA: Insufficient documentation

## 2022-03-09 DIAGNOSIS — Z87891 Personal history of nicotine dependence: Secondary | ICD-10-CM | POA: Diagnosis not present

## 2022-05-02 ENCOUNTER — Encounter (HOSPITAL_COMMUNITY)
Admission: RE | Disposition: A | Discharge status: Home / Self Care | Payer: Self-pay | Source: Home / Self Care | Attending: Gastroenterology

## 2022-05-02 ENCOUNTER — Ambulatory Visit (HOSPITAL_COMMUNITY)
Admission: RE | Admit: 2022-05-02 | Discharge: 2022-05-02 | Disposition: A | Discharge status: Home / Self Care | Payer: 59 | Attending: Gastroenterology | Admitting: Gastroenterology

## 2022-05-02 DIAGNOSIS — R1310 Dysphagia, unspecified: Secondary | ICD-10-CM | POA: Insufficient documentation

## 2022-05-02 HISTORY — PX: ESOPHAGEAL MANOMETRY: SHX5429

## 2022-05-02 SURGERY — MANOMETRY, ESOPHAGUS
Laterality: Bilateral

## 2022-05-02 MED ORDER — LIDOCAINE VISCOUS HCL 2 % MT SOLN
OROMUCOSAL | Status: AC
  Filled 2022-05-02: qty 15

## 2022-05-02 SURGICAL SUPPLY — 2 items
FACESHIELD LNG OPTICON STERILE (SAFETY) IMPLANT
GLOVE BIO SURGEON STRL SZ8 (GLOVE) ×2 IMPLANT

## 2022-05-02 NOTE — Progress Notes (Signed)
Esophageal Manometry done per protocol. Patient tolerated well without distress or complication.  

## 2022-05-03 DIAGNOSIS — K219 Gastro-esophageal reflux disease without esophagitis: Secondary | ICD-10-CM | POA: Diagnosis not present

## 2022-05-03 DIAGNOSIS — R1310 Dysphagia, unspecified: Secondary | ICD-10-CM | POA: Diagnosis not present

## 2022-05-04 ENCOUNTER — Encounter (HOSPITAL_COMMUNITY): Payer: Self-pay | Admitting: Gastroenterology

## 2022-06-13 DIAGNOSIS — F909 Attention-deficit hyperactivity disorder, unspecified type: Secondary | ICD-10-CM | POA: Diagnosis not present

## 2022-06-13 DIAGNOSIS — F411 Generalized anxiety disorder: Secondary | ICD-10-CM | POA: Diagnosis not present

## 2022-06-13 DIAGNOSIS — R030 Elevated blood-pressure reading, without diagnosis of hypertension: Secondary | ICD-10-CM | POA: Diagnosis not present

## 2022-06-13 DIAGNOSIS — F322 Major depressive disorder, single episode, severe without psychotic features: Secondary | ICD-10-CM | POA: Diagnosis not present

## 2022-06-13 DIAGNOSIS — Z79899 Other long term (current) drug therapy: Secondary | ICD-10-CM | POA: Diagnosis not present

## 2022-06-20 DIAGNOSIS — R1013 Epigastric pain: Secondary | ICD-10-CM | POA: Diagnosis not present

## 2022-06-20 DIAGNOSIS — K219 Gastro-esophageal reflux disease without esophagitis: Secondary | ICD-10-CM | POA: Diagnosis not present

## 2022-06-20 DIAGNOSIS — R1310 Dysphagia, unspecified: Secondary | ICD-10-CM | POA: Diagnosis not present
